# Patient Record
Sex: Male | Born: 1970 | Race: Black or African American | Hispanic: No | Marital: Married
Health system: Southern US, Community
[De-identification: ages and names within clinical notes are randomized; demographics above are authoritative.]

---

## 2014-07-31 ENCOUNTER — Ambulatory Visit (INDEPENDENT_AMBULATORY_CARE_PROVIDER_SITE_OTHER): Payer: BC Managed Care – PPO

## 2014-07-31 ENCOUNTER — Other Ambulatory Visit: Payer: Self-pay | Admitting: Family Medicine

## 2014-07-31 DIAGNOSIS — E041 Nontoxic single thyroid nodule: Secondary | ICD-10-CM

## 2015-11-12 IMAGING — US US SOFT TISSUE HEAD/NECK
1 series · 14 of 25 positions shown · non-contrast
Comparison: None.

CLINICAL DATA: Thyroid nodule by physical exam.

EXAM:
THYROID ULTRASOUND
TECHNIQUE: Ultrasound examination of the thyroid gland and adjacent soft
tissues was performed.

[Series 1: us soft tissue head/neck · 0.07mm/px · 14 of 41 slices shown]
[im 1/41]
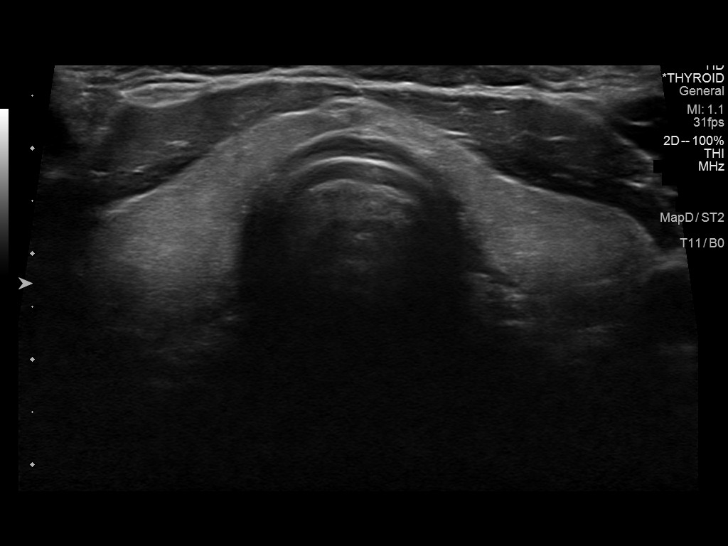
[im 4/41]
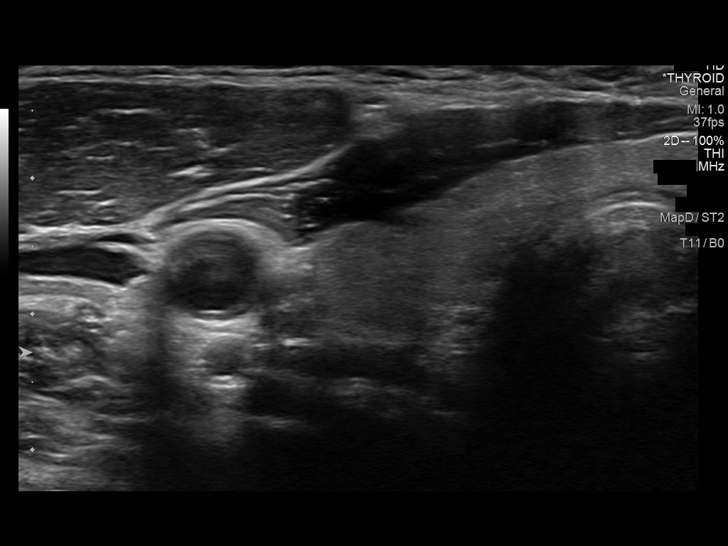
[im 7/41]
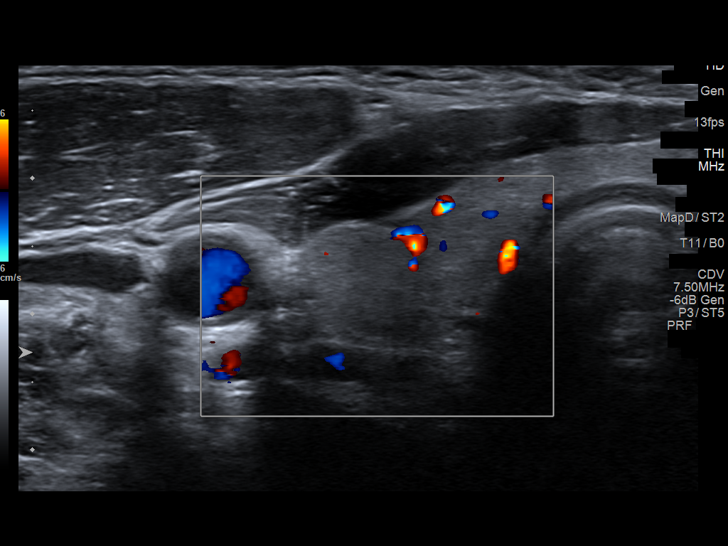
[im 11/41]
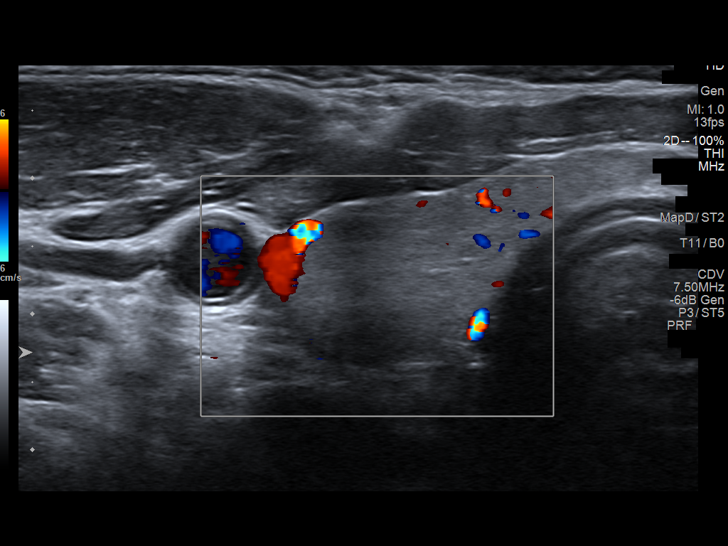
[im 14/41]
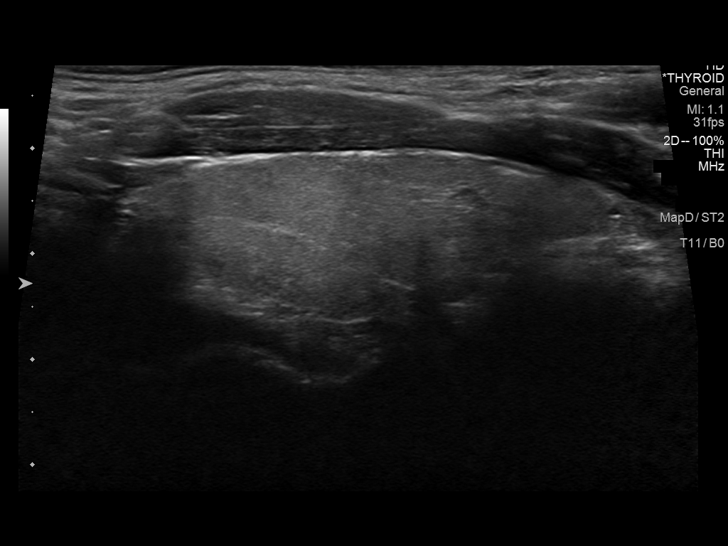
[im 16/41]
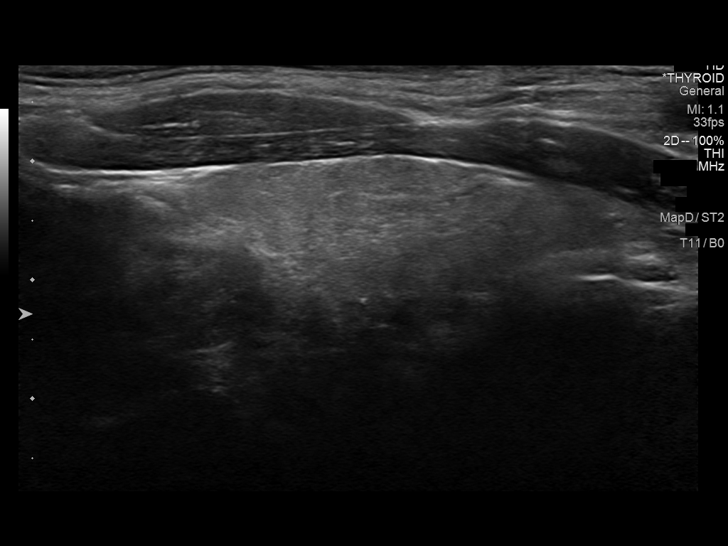
[im 19/41]
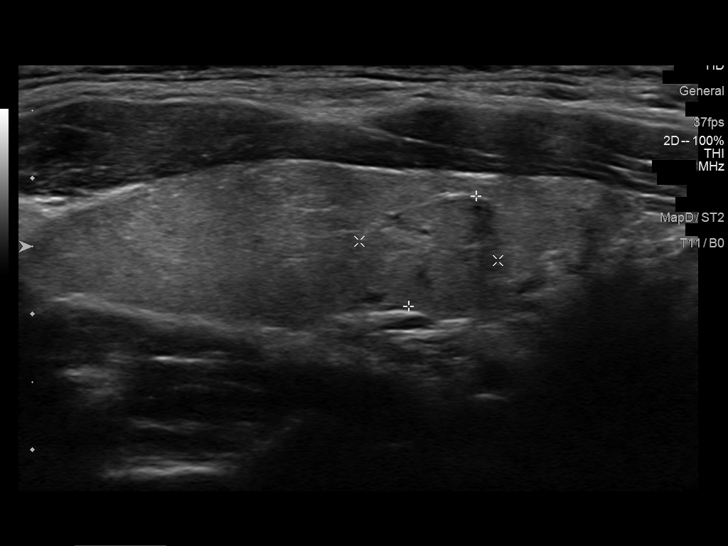
[im 22/41]
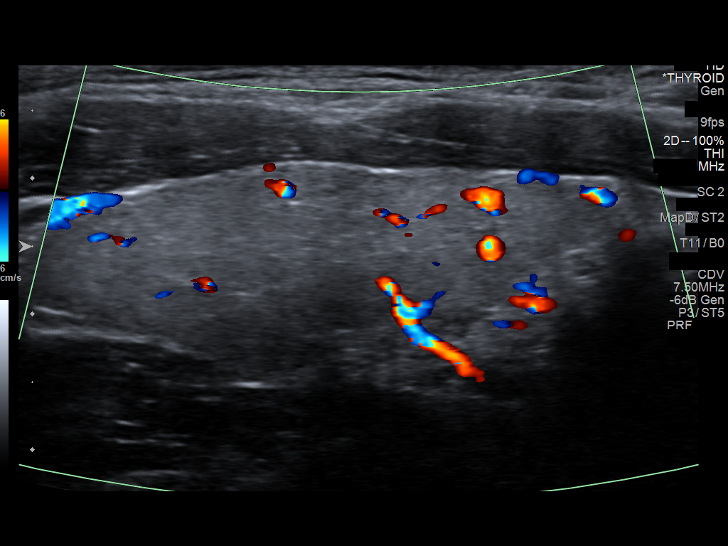
[im 26/41]
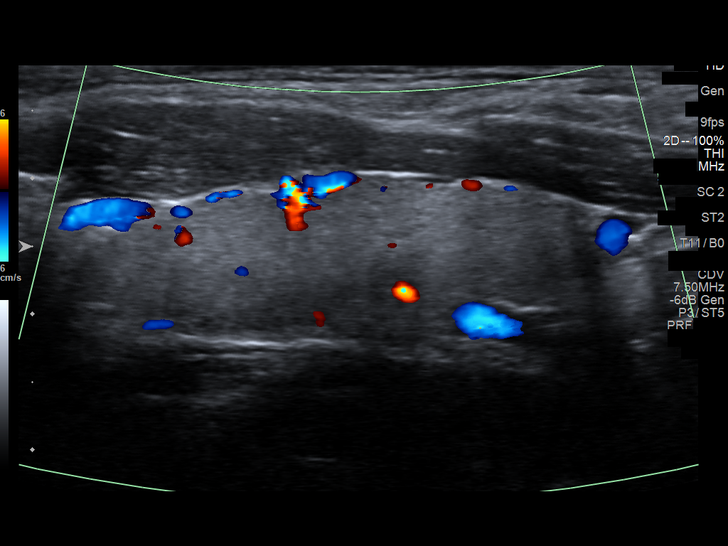
[im 27/41]
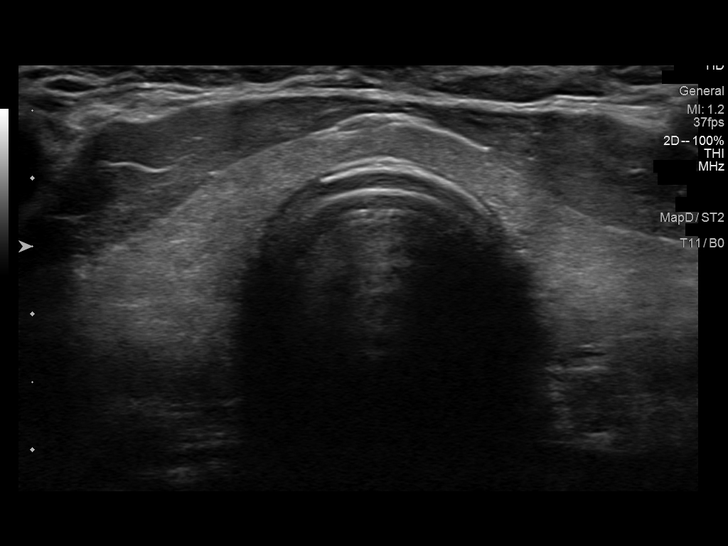
[im 31/41]
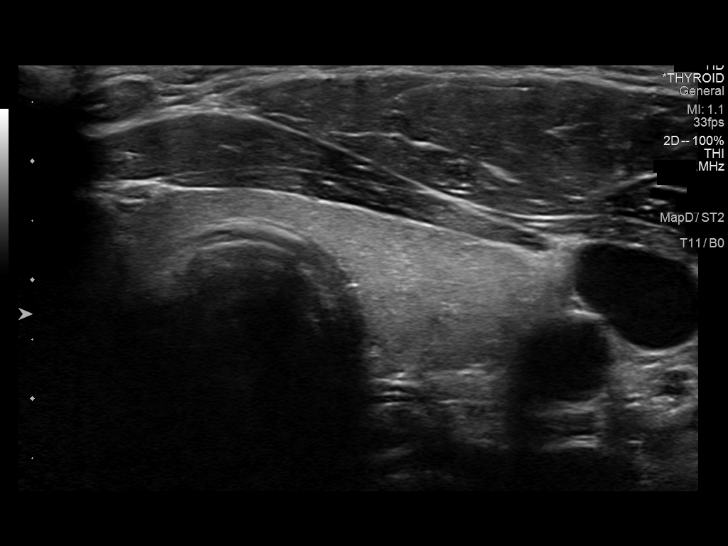
[im 34/41]
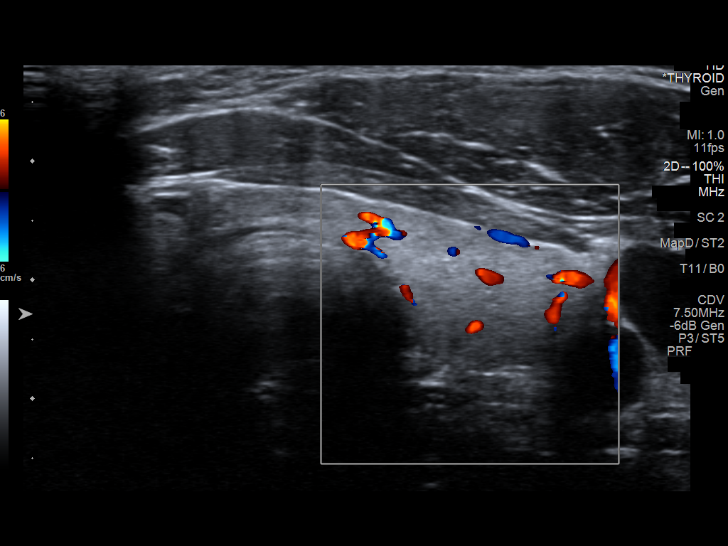
[im 37/41]
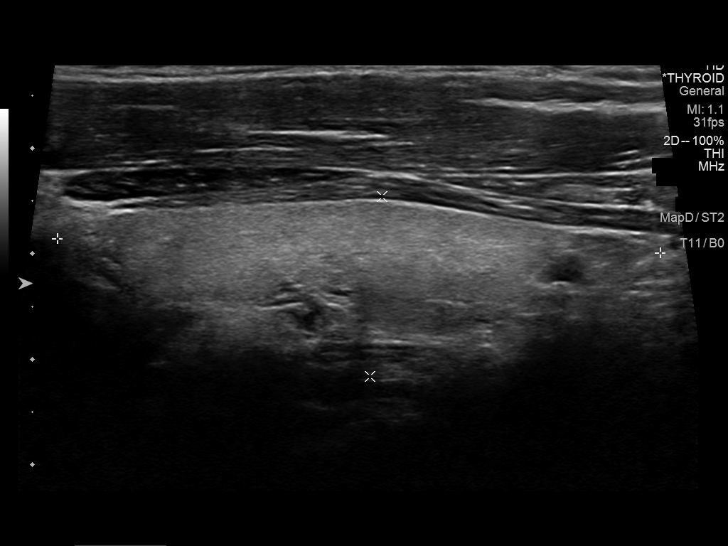
[im 41/41]
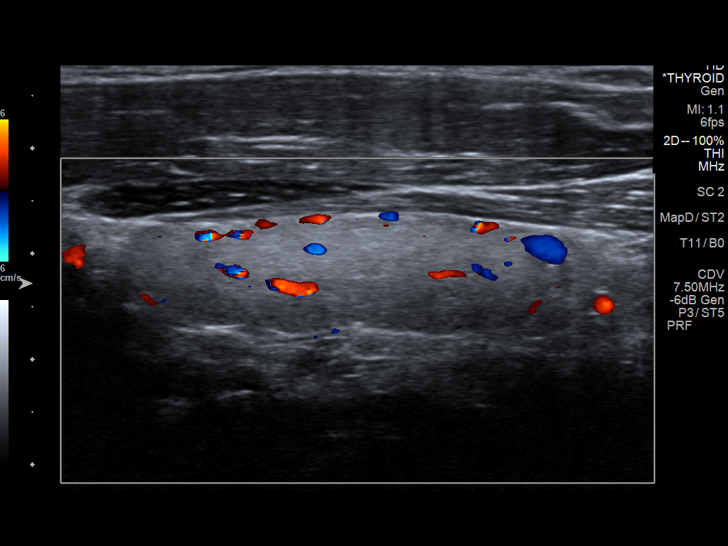

[14 of 25 positions shown; findings below may reference images not displayed]

FINDINGS: Right thyroid lobe

Measurements: 5.0 x 1.7 x 1.8 cm. Right lower pole nodule measures
10 x 10 x 11 mm. It is solid, mildly heterogeneous and isoechoic.

Left thyroid lobe

Measurements: 5.7 x 1.7 x 1.9 cm.  No nodules visualized.

Isthmus

Thickness: 3 mm in thickness.  No nodules visualized.

Lymphadenopathy

None visualized.
IMPRESSION: Solitary right lower pole nodule measures 11 mm in greatest
diameter. Findings do not meet current SRU consensus criteria for
biopsy. Follow-up by clinical exam is recommended. If patient has
known risk factors for thyroid carcinoma, consider follow-up
ultrasound in 12 months. If patient is clinically hyperthyroid,
consider nuclear medicine thyroid uptake and scan.Reference:
Management of Thyroid Nodules Detected at US: Society of
Radiologists in Ultrasound Consensus Conference Statement. Radiology
